# Patient Record
Sex: Female | Born: 2000 | Hispanic: No | Marital: Single | Smoking: Never smoker
Health system: Southern US, Community
[De-identification: ages and names within clinical notes are randomized; demographics above are authoritative.]

## PROBLEM LIST (undated history)

## (undated) DIAGNOSIS — Z789 Other specified health status: Secondary | ICD-10-CM

## (undated) HISTORY — PX: TONSILLECTOMY: SUR1361

---
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
## 2023-11-06 ENCOUNTER — Telehealth: Payer: Self-pay

## 2023-11-06 NOTE — Telephone Encounter (Signed)
 Patients mother called to reschedule her daughters appointments. I rescheduled for November 3rd

## 2023-11-21 ENCOUNTER — Encounter

## 2023-11-21 ENCOUNTER — Other Ambulatory Visit

## 2023-12-03 ENCOUNTER — Inpatient Hospital Stay

## 2023-12-03 VITALS — BP 122/85 | HR 86 | Temp 97.9°F | Resp 20 | Wt 152.6 lb

## 2023-12-03 DIAGNOSIS — N921 Excessive and frequent menstruation with irregular cycle: Secondary | ICD-10-CM | POA: Insufficient documentation

## 2023-12-03 DIAGNOSIS — D509 Iron deficiency anemia, unspecified: Secondary | ICD-10-CM

## 2023-12-03 LAB — CBC WITH DIFFERENTIAL (CANCER CENTER ONLY)
Abs Immature Granulocytes: 0.01 K/uL (ref 0.00–0.07)
Basophils Absolute: 0.1 K/uL (ref 0.0–0.1)
Basophils Relative: 1 %
Eosinophils Absolute: 0 K/uL (ref 0.0–0.5)
Eosinophils Relative: 0 %
HCT: 35 % — ABNORMAL LOW (ref 36.0–46.0)
Hemoglobin: 11.5 g/dL — ABNORMAL LOW (ref 12.0–15.0)
Immature Granulocytes: 0 %
Lymphocytes Relative: 41 %
Lymphs Abs: 2.9 K/uL (ref 0.7–4.0)
MCH: 20.8 pg — ABNORMAL LOW (ref 26.0–34.0)
MCHC: 32.9 g/dL (ref 30.0–36.0)
MCV: 63.4 fL — ABNORMAL LOW (ref 80.0–100.0)
Monocytes Absolute: 0.5 K/uL (ref 0.1–1.0)
Monocytes Relative: 7 %
Neutro Abs: 3.6 K/uL (ref 1.7–7.7)
Neutrophils Relative %: 51 %
Platelet Count: 465 K/uL — ABNORMAL HIGH (ref 150–400)
RBC: 5.52 MIL/uL — ABNORMAL HIGH (ref 3.87–5.11)
RDW: 16.2 % — ABNORMAL HIGH (ref 11.5–15.5)
WBC Count: 7.1 K/uL (ref 4.0–10.5)
nRBC: 0 % (ref 0.0–0.2)

## 2023-12-03 LAB — FERRITIN: Ferritin: 6 ng/mL — ABNORMAL LOW (ref 11–307)

## 2023-12-03 MED ORDER — FERROUS SULFATE 325 (65 FE) MG PO TBEC: 325.0000 mg | DELAYED_RELEASE_TABLET | Freq: Every evening | ORAL | 1 refills | Status: AC

## 2023-12-03 NOTE — Progress Notes (Signed)
 [facility] CONSULT NOTE  Patient Care Team: Davia Medford, NP as PCP - General (Pediatrics)  ASSESSMENT & PLAN:  Veronica Harper is a 23 y.o. female with history of heavy menstrual cycles being seen for iron deficiency anemia.  Relevant history: History of menstrual cycles   The mechanism of IDA is due to either blood loss or decreased absorptive mechanism or both. She has PICA with ice craving. Discussed trial of oral iron. If cannot tolerate will order iv iron.  Assessment & Plan Microcytic anemia CBC, Iron, TIBC, ferritin Ferrous sulfate 325 mg nightly Follow up in Feb with lab   Orders Placed This Encounter  Procedures   CBC with Differential (Cancer Center Only)    Standing Status:   Future    Number of Occurrences:   1    Expiration Date:   12/02/2024   Ferritin    Standing Status:   Future    Number of Occurrences:   1    Expiration Date:   12/02/2024   All questions were answered. The patient knows to call the clinic with any problems, questions or concerns.  Pauletta JAYSON Chihuahua, MD 11/3/20254:56 PM   CHIEF COMPLAINTS/PURPOSE OF CONSULTATION:  Anemia  HISTORY OF PRESENTING ILLNESS:  Veronica Harper 23 y.o. female is here because of anemia.  From outside lab dated 09/13/2023 hemoglobin 11.8 MCV 67.  Platelet 484.  WBC was 7.7.  On-call health record in 2019 showed hemoglobin 12.4 MCV of 72.  No medical problem growing.  Report menstrual bleeding sometimes twice a month. She she bleeds 6 days each time. Heavy per patient. Soaked with blood and multiple pads a day.  Lore had not noticed any recent bleeding such as melena, hematuria or hematochezia  She reports craving ice and chews ice for long time.   She denies blood donation or received blood transfusion  The patient does not take iron and has not tried.  MEDICAL HISTORY:  Past Medical History:  Diagnosis Date   Medical history non-contributory     SURGICAL HISTORY: Past Surgical  History:  Procedure Laterality Date   DENTAL RESTORATION/EXTRACTION WITH X-RAY N/A 07/07/2016   Procedure: DENTAL RESTORATION/EXTRACTION WITH X-RAY;  Surgeon: Grooms, Ozell Boas, DDS;  Location: [facility] ORS;  Service: Dentistry;  Laterality: N/A;   LAPAROSCOPIC APPENDECTOMY N/A 01/22/2018   Procedure: APPENDECTOMY LAPAROSCOPIC;  Surgeon: Claudius Kaplan, MD;  Location: [facility] OR;  Service: Pediatrics;  Laterality: N/A;   TONSILLECTOMY      SOCIAL HISTORY: Social History   Socioeconomic History   Marital status: Single    Spouse name: Not on file   Number of children: Not on file   Years of education: Not on file   Highest education level: Not on file  Occupational History   Not on file  Tobacco Use   Smoking status: Never   Smokeless tobacco: Never  Vaping Use   Vaping status: Never Used  Substance and Sexual Activity   Alcohol use: No   Drug use: No   Sexual activity: Not Currently  Other Topics Concern   Not on file  Social History Narrative   She is in 8th grade at [employer]. She does well in school.    She enjoys drawing, and playing on her tablet   Social Drivers of Health   Financial Resource Strain: Not on file  Food Insecurity: No Food Insecurity (12/03/2023)   Hunger Vital Sign    Worried About Running Out of Food in the Last Year:  Never true    Ran Out of Food in the Last Year: Never true  Transportation Needs: No Transportation Needs (12/03/2023)   PRAPARE - Administrator, Civil Service (Medical): No    Lack of Transportation (Non-Medical): No  Physical Activity: Not on file  Stress: Not on file  Social Connections: Not on file  Intimate Partner Violence: Not At Risk (12/03/2023)   Humiliation, Afraid, Rape, and Kick questionnaire    Fear of Current or Ex-Partner: No    Emotionally Abused: No    Physically Abused: No    Sexually Abused: No    FAMILY HISTORY: Family History  Problem Relation Age of Onset   Thyroid disease  Mother    Hypertension Mother    Diabetes Father    Healthy Sister    Healthy Brother    Diabetes Maternal Grandmother    Healthy Maternal Grandfather    Healthy Paternal Grandmother    Healthy Paternal Grandfather    Healthy Brother     ALLERGIES:  has no known allergies.  MEDICATIONS:  Current Outpatient Medications  Medication Sig Dispense Refill   Cholecalciferol (VITAMIN D3) 2000 units TABS Take 2,000 Units by mouth daily at 3 pm.     ferrous sulfate 325 (65 FE) MG EC tablet Take 1 tablet (325 mg total) by mouth at bedtime. 90 tablet 1   loratadine (CLARITIN) 10 MG tablet Take 10 mg by mouth at bedtime as needed for allergies.     Multiple Vitamin (MULTIVITAMIN WITH MINERALS) TABS tablet Take 1 tablet by mouth daily at 3 pm.     No current facility-administered medications for this visit.    REVIEW OF SYSTEMS:   All relevant systems were reviewed with the patient and are negative.  PHYSICAL EXAMINATION:  Vitals:   12/03/23 1026  BP: 122/85  Pulse: 86  Resp: 20  Temp: 97.9 F (36.6 C)  SpO2: 100%   Filed Weights   12/03/23 1026  Weight: 152 lb 9.6 oz (69.2 kg)    GENERAL: alert, no distress and comfortable SKIN: skin color normal EYES: normal conjunctiva, sclera clear  Labs ordered.

## 2023-12-04 ENCOUNTER — Telehealth: Payer: Self-pay

## 2023-12-04 NOTE — Telephone Encounter (Signed)
 LVM on identified VM with the message from Dr Tina about patient's new medication and what to watch for. Arland Legions BSN RN

## 2023-12-04 NOTE — Telephone Encounter (Signed)
-----   Message from Pauletta JAYSON Chihuahua sent at 12/03/2023  5:00 PM EST ----- Veronica Harper please let mom know take iron nightly. I sent to her pharmacy. If cannot tolerate please call for IV iron. Iv iron has small risk of anaphylaxis, headache and nausea. Thanks.

## 2024-03-14 ENCOUNTER — Inpatient Hospital Stay

## 2024-03-17 ENCOUNTER — Inpatient Hospital Stay
